# Patient Record
Sex: Female | Born: 2000
Health system: Southern US, Community
[De-identification: ages and names within clinical notes are randomized; demographics above are authoritative.]

## PROBLEM LIST (undated history)

## (undated) DIAGNOSIS — F329 Major depressive disorder, single episode, unspecified: Secondary | ICD-10-CM

## (undated) DIAGNOSIS — F419 Anxiety disorder, unspecified: Secondary | ICD-10-CM

## (undated) DIAGNOSIS — F32A Depression, unspecified: Secondary | ICD-10-CM

## (undated) DIAGNOSIS — F909 Attention-deficit hyperactivity disorder, unspecified type: Secondary | ICD-10-CM

---
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
## 2018-06-09 DIAGNOSIS — F909 Attention-deficit hyperactivity disorder, unspecified type: Secondary | ICD-10-CM | POA: Diagnosis not present

## 2018-06-09 DIAGNOSIS — F419 Anxiety disorder, unspecified: Secondary | ICD-10-CM | POA: Diagnosis not present

## 2018-06-09 DIAGNOSIS — R079 Chest pain, unspecified: Secondary | ICD-10-CM | POA: Diagnosis not present

## 2018-06-09 DIAGNOSIS — R55 Syncope and collapse: Secondary | ICD-10-CM | POA: Diagnosis not present

## 2018-06-24 DIAGNOSIS — Z0000 Encounter for general adult medical examination without abnormal findings: Secondary | ICD-10-CM | POA: Diagnosis not present

## 2018-06-24 DIAGNOSIS — F419 Anxiety disorder, unspecified: Secondary | ICD-10-CM | POA: Diagnosis not present

## 2018-06-24 DIAGNOSIS — F909 Attention-deficit hyperactivity disorder, unspecified type: Secondary | ICD-10-CM | POA: Diagnosis not present

## 2018-07-07 DIAGNOSIS — Z3202 Encounter for pregnancy test, result negative: Secondary | ICD-10-CM | POA: Diagnosis not present

## 2018-07-07 DIAGNOSIS — N83209 Unspecified ovarian cyst, unspecified side: Secondary | ICD-10-CM | POA: Diagnosis not present

## 2018-07-07 DIAGNOSIS — R102 Pelvic and perineal pain: Secondary | ICD-10-CM | POA: Diagnosis not present

## 2018-07-07 DIAGNOSIS — N390 Urinary tract infection, site not specified: Secondary | ICD-10-CM | POA: Diagnosis not present

## 2018-08-07 DIAGNOSIS — N390 Urinary tract infection, site not specified: Secondary | ICD-10-CM | POA: Diagnosis not present

## 2018-08-07 DIAGNOSIS — Z01419 Encounter for gynecological examination (general) (routine) without abnormal findings: Secondary | ICD-10-CM | POA: Diagnosis not present

## 2018-08-07 DIAGNOSIS — Z113 Encounter for screening for infections with a predominantly sexual mode of transmission: Secondary | ICD-10-CM | POA: Diagnosis not present

## 2018-08-07 DIAGNOSIS — Z6825 Body mass index (BMI) 25.0-25.9, adult: Secondary | ICD-10-CM | POA: Diagnosis not present

## 2018-08-07 DIAGNOSIS — N760 Acute vaginitis: Secondary | ICD-10-CM | POA: Diagnosis not present

## 2018-09-29 DIAGNOSIS — Z3009 Encounter for other general counseling and advice on contraception: Secondary | ICD-10-CM | POA: Diagnosis not present

## 2018-09-29 DIAGNOSIS — N760 Acute vaginitis: Secondary | ICD-10-CM | POA: Diagnosis not present

## 2018-09-29 DIAGNOSIS — Z113 Encounter for screening for infections with a predominantly sexual mode of transmission: Secondary | ICD-10-CM | POA: Diagnosis not present

## 2018-09-30 IMAGING — MR MR PELVIS W/O CM
6 of 8 series · 32 of 48 positions shown · non-contrast
Comparison: CT scan 06/08/2015

CLINICAL DATA: Possibly uterine anomaly.

EXAM:
MRI PELVIS WITHOUT CONTRAST
TECHNIQUE: Multiplanar multisequence MR imaging of the pelvis was performed. No
intravenous contrast was administered.

[Series 3: t2_tse_sag · sagittal · 5.0mm · 1.05mm/px · 6 of 27 slices shown]
[im 1/27]
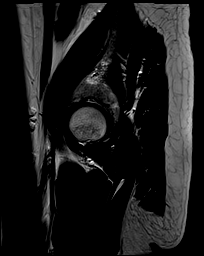
[im 6/27]
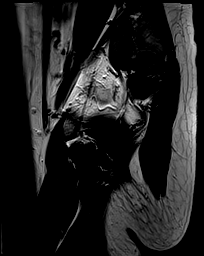
[im 11/27]
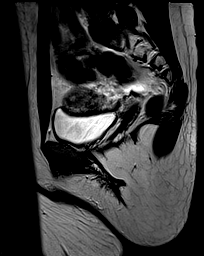
[im 16/27]
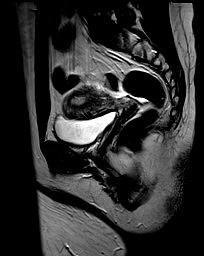
[im 21/27]
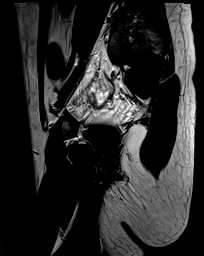
[im 27/27]
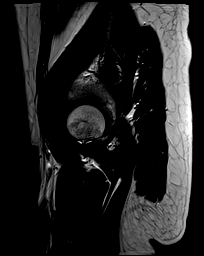

[Series 4: t2_tse axial · axial · 5.0mm · 0.55mm/px · z∈[-128,+22]mm · 6 of 26 slices shown]
[im 1/26]
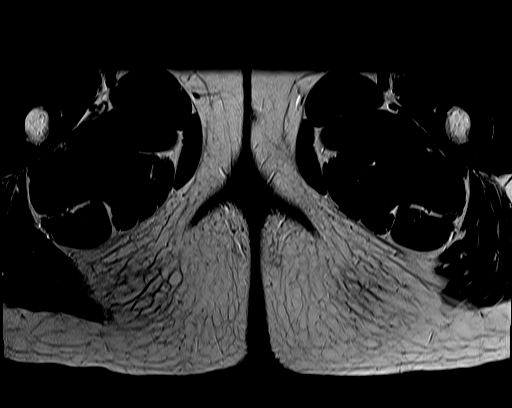
[im 6/26]
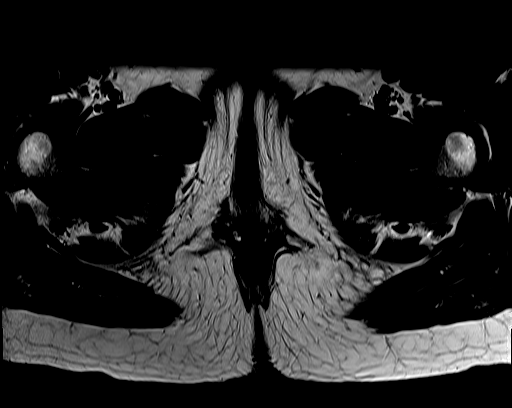
[im 11/26]
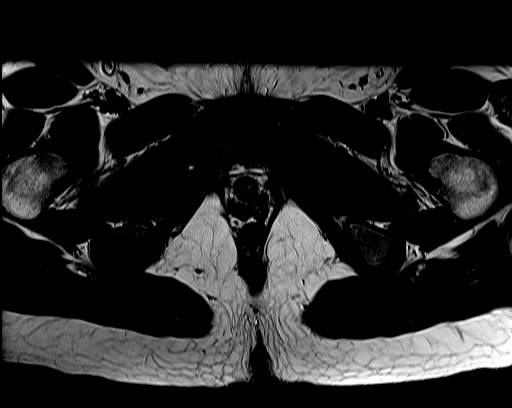
[im 16/26]
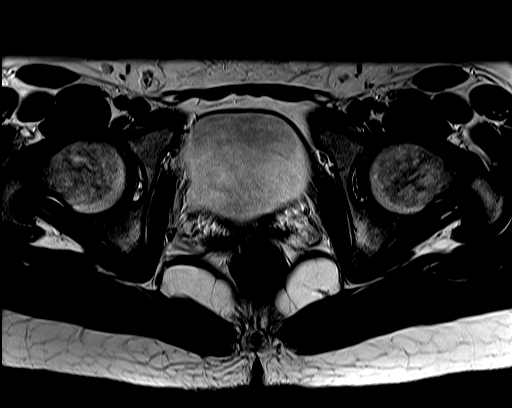
[im 21/26]
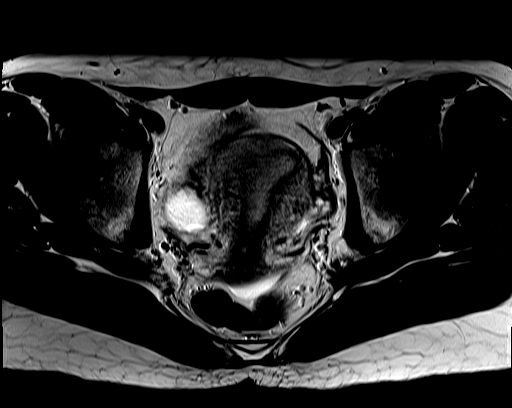
[im 26/26]
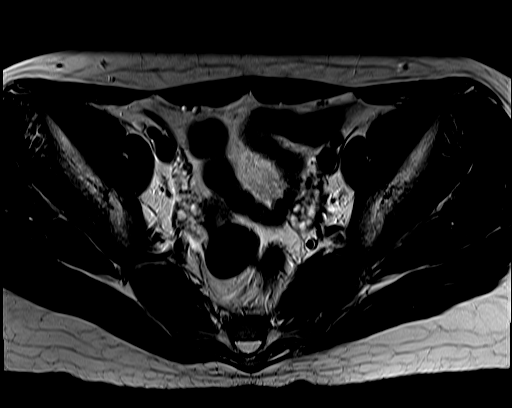

[Series 5: t2_tse axial fs · axial · 5.0mm · 0.55mm/px · z∈[-128,+22]mm · 6 of 26 slices shown]
[im 1/26]
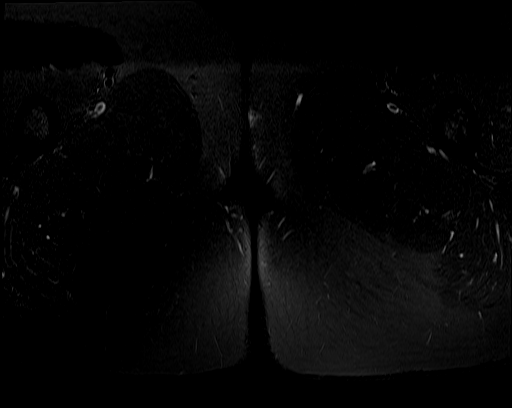
[im 6/26]
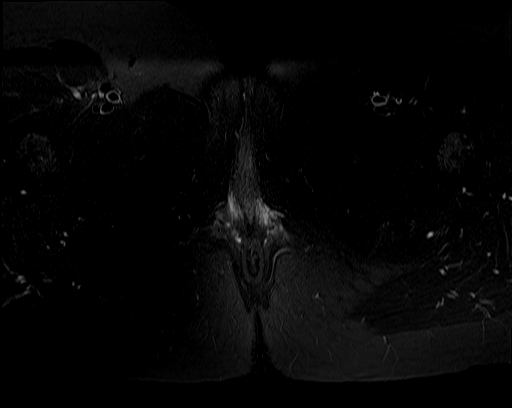
[im 11/26]
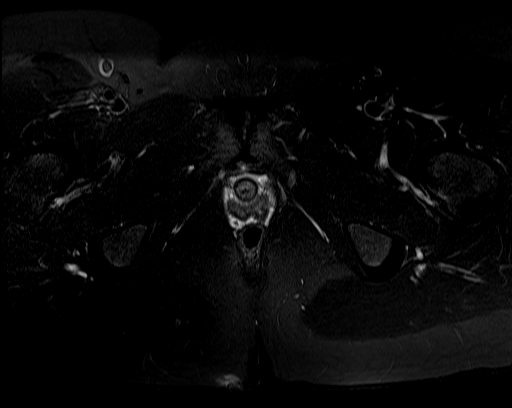
[im 16/26]
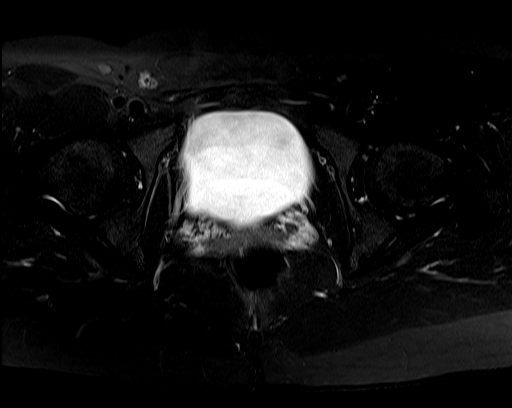
[im 21/26]
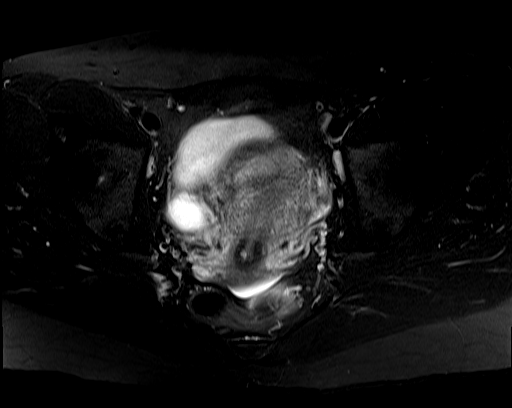
[im 26/26]
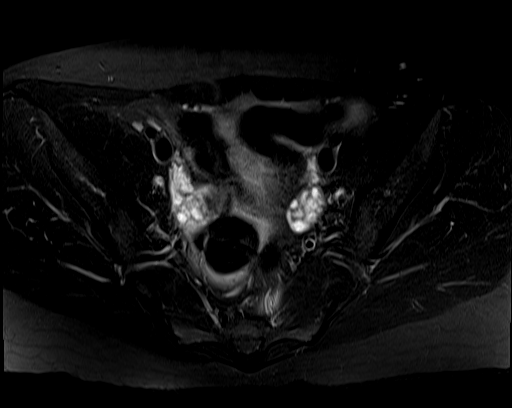

[Series 6: axial spgr · axial · 5.0mm · 0.94mm/px · z∈[-128,+22]mm · 6 of 26 slices shown]
[im 1/26]
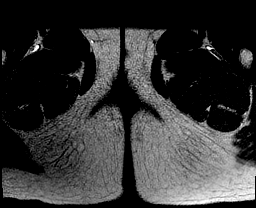
[im 6/26]
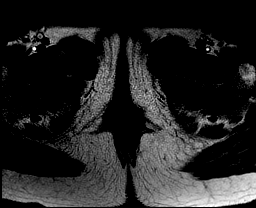
[im 11/26]
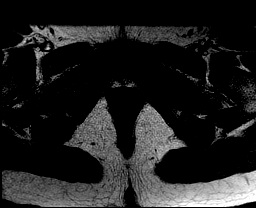
[im 16/26]
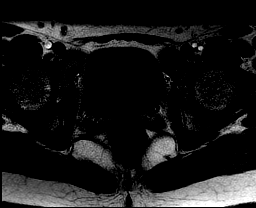
[im 21/26]
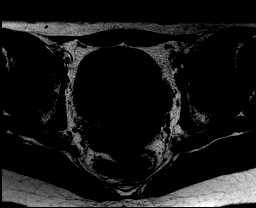
[im 26/26]
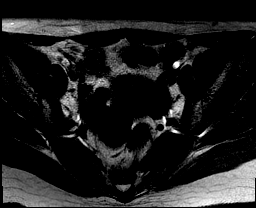

[Series 8: axial spgr pre · axial · non-contrast · 5.0mm · 0.47mm/px · z∈[-128,+22]mm · 6 of 26 slices shown]
[im 1/26]
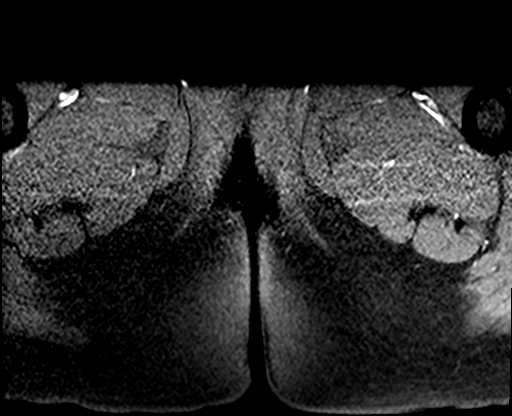
[im 6/26]
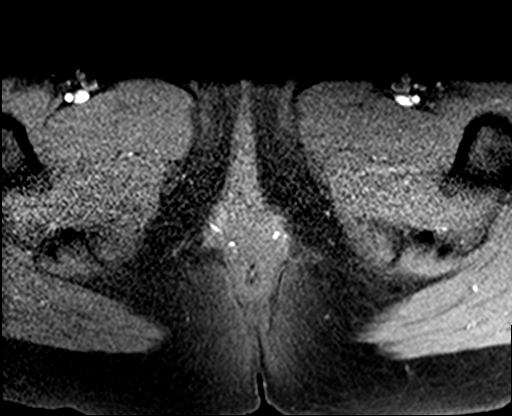
[im 11/26]
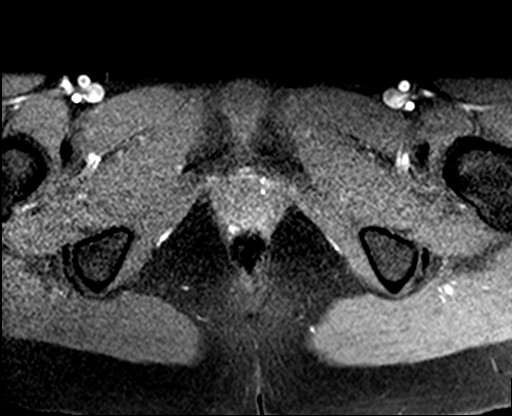
[im 16/26]
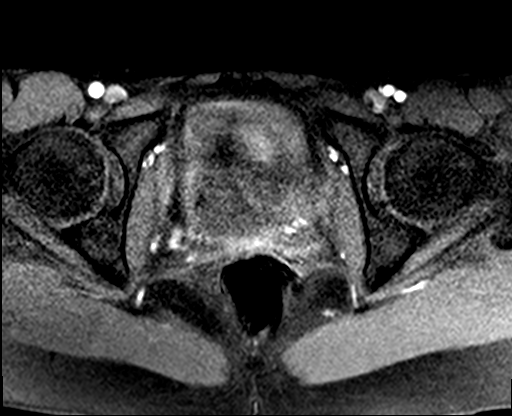
[im 21/26]
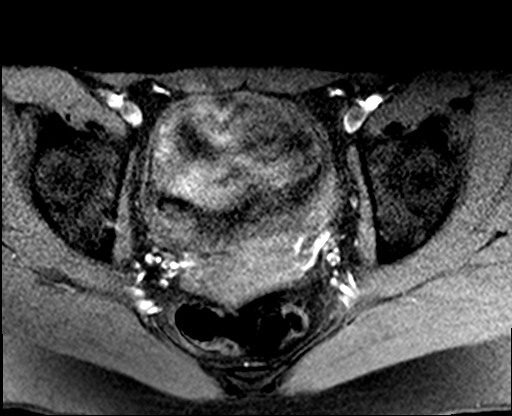
[im 26/26]
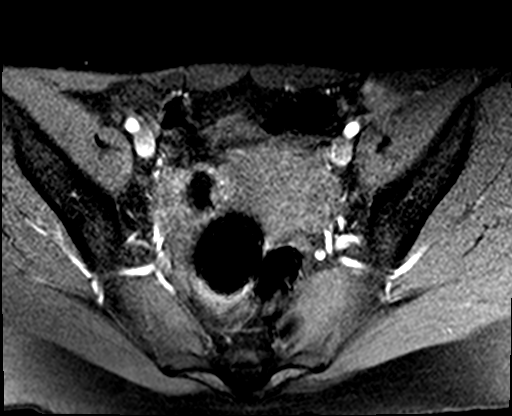

[Series 9: cor haste · coronal · 6.0mm · 0.78mm/px · 2 of 25 slices shown]
[im 1/25]
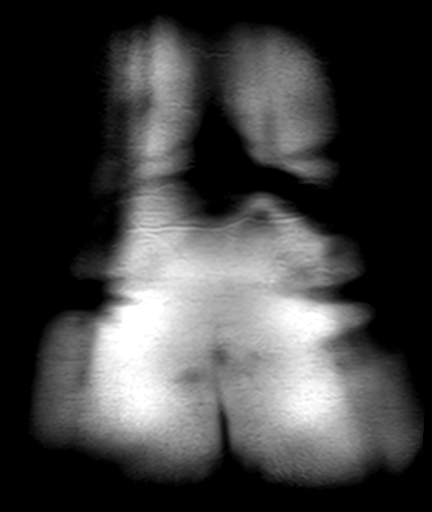
[im 5/25]
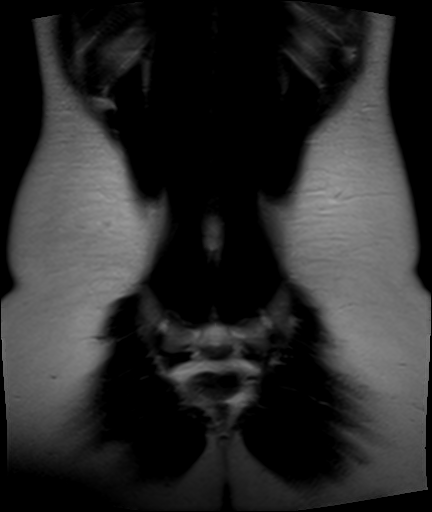

[32 of 48 positions shown; findings below may reference images not displayed]

FINDINGS: Urinary Tract: Urinary bladder unremarkable. Normal urethra without
evidence for diverticulum.

Bowel:  Visualized bowel loops unremarkable.

Vascular/Lymphatic: No evidence for aortic aneurysm. No pelvic
sidewall lymphadenopathy.

Reproductive: Uterus measures 8.3 x 3.3 x 5.5 cm. Junctional zone is
well preserved throughout. No uterine mass evident. Uterine fundal
anatomy is normal without evidence for arcuate, septate, or
bicornuate uterus.

Right ovary measures 3.1 x 4.6 x 3.9 cm. Bilobed homogeneous
well-defined cyst in the right ovary measures 2.8 x 3.8 x 2.6 cm.

Left ovary measures 1.8 x 4.3 x 3.0 cm and is normal.

Other:  Trace free fluid noted in the cul-de-sac.

Musculoskeletal: No abnormal marrow signal within the visualized
bony anatomy.
IMPRESSION: 1. No evidence for Mullerian duct anomaly.
2. 3.8 cm cyst with benign features noted in the right ovary.
3. Trace intraperitoneal free fluid in, likely physiologic.

## 2018-10-14 DIAGNOSIS — J029 Acute pharyngitis, unspecified: Secondary | ICD-10-CM | POA: Diagnosis not present

## 2018-10-14 DIAGNOSIS — N926 Irregular menstruation, unspecified: Secondary | ICD-10-CM | POA: Diagnosis not present

## 2018-10-14 DIAGNOSIS — R6889 Other general symptoms and signs: Secondary | ICD-10-CM | POA: Diagnosis not present

## 2019-02-24 DIAGNOSIS — F909 Attention-deficit hyperactivity disorder, unspecified type: Secondary | ICD-10-CM | POA: Diagnosis not present

## 2019-02-24 DIAGNOSIS — Z7189 Other specified counseling: Secondary | ICD-10-CM | POA: Diagnosis not present

## 2019-02-24 DIAGNOSIS — R0602 Shortness of breath: Secondary | ICD-10-CM | POA: Diagnosis not present

## 2019-02-24 DIAGNOSIS — F4321 Adjustment disorder with depressed mood: Secondary | ICD-10-CM | POA: Diagnosis not present

## 2019-02-27 DIAGNOSIS — F410 Panic disorder [episodic paroxysmal anxiety] without agoraphobia: Secondary | ICD-10-CM | POA: Diagnosis not present

## 2019-02-27 DIAGNOSIS — F320 Major depressive disorder, single episode, mild: Secondary | ICD-10-CM | POA: Diagnosis not present

## 2019-02-27 DIAGNOSIS — F419 Anxiety disorder, unspecified: Secondary | ICD-10-CM | POA: Diagnosis not present

## 2019-02-27 DIAGNOSIS — F909 Attention-deficit hyperactivity disorder, unspecified type: Secondary | ICD-10-CM | POA: Diagnosis not present

## 2019-04-09 DIAGNOSIS — F419 Anxiety disorder, unspecified: Secondary | ICD-10-CM | POA: Diagnosis not present

## 2019-04-09 DIAGNOSIS — F909 Attention-deficit hyperactivity disorder, unspecified type: Secondary | ICD-10-CM | POA: Diagnosis not present

## 2019-04-09 DIAGNOSIS — F320 Major depressive disorder, single episode, mild: Secondary | ICD-10-CM | POA: Diagnosis not present

## 2019-04-09 DIAGNOSIS — F410 Panic disorder [episodic paroxysmal anxiety] without agoraphobia: Secondary | ICD-10-CM | POA: Diagnosis not present

## 2019-04-22 DIAGNOSIS — N760 Acute vaginitis: Secondary | ICD-10-CM | POA: Diagnosis not present

## 2019-04-22 DIAGNOSIS — Z113 Encounter for screening for infections with a predominantly sexual mode of transmission: Secondary | ICD-10-CM | POA: Diagnosis not present

## 2019-04-22 DIAGNOSIS — R350 Frequency of micturition: Secondary | ICD-10-CM | POA: Diagnosis not present

## 2019-05-08 DIAGNOSIS — F320 Major depressive disorder, single episode, mild: Secondary | ICD-10-CM | POA: Diagnosis not present

## 2019-05-08 DIAGNOSIS — F410 Panic disorder [episodic paroxysmal anxiety] without agoraphobia: Secondary | ICD-10-CM | POA: Diagnosis not present

## 2019-05-08 DIAGNOSIS — F419 Anxiety disorder, unspecified: Secondary | ICD-10-CM | POA: Diagnosis not present

## 2019-05-08 DIAGNOSIS — F909 Attention-deficit hyperactivity disorder, unspecified type: Secondary | ICD-10-CM | POA: Diagnosis not present

## 2019-06-16 ENCOUNTER — Encounter (HOSPITAL_COMMUNITY): Payer: Self-pay

## 2019-06-16 ENCOUNTER — Emergency Department (HOSPITAL_COMMUNITY)
Admission: EM | Admit: 2019-06-16 | Discharge: 2019-06-17 | Discharge status: Against Medical Advice | Payer: BC Managed Care – PPO | Attending: Emergency Medicine | Admitting: Emergency Medicine

## 2019-06-16 ENCOUNTER — Other Ambulatory Visit: Payer: Self-pay

## 2019-06-16 DIAGNOSIS — Z5321 Procedure and treatment not carried out due to patient leaving prior to being seen by health care provider: Secondary | ICD-10-CM | POA: Diagnosis not present

## 2019-06-16 DIAGNOSIS — R55 Syncope and collapse: Secondary | ICD-10-CM | POA: Diagnosis not present

## 2019-06-16 HISTORY — DX: Attention-deficit hyperactivity disorder, unspecified type: F90.9

## 2019-06-16 HISTORY — DX: Depression, unspecified: F32.A

## 2019-06-16 HISTORY — DX: Anxiety disorder, unspecified: F41.9

## 2019-06-16 MED ORDER — SODIUM CHLORIDE 0.9% FLUSH: 3.0000 mL | Freq: Once | INTRAVENOUS | Status: DC

## 2019-06-16 NOTE — ED Triage Notes (Signed)
Pt states that she has been feeling bad for the past couple of days, generalized weakness and headaches, today had a syncopal episode at work with episode of vomiting, pt also having dysuria and foul odor of urine

## 2019-06-17 DIAGNOSIS — R8290 Unspecified abnormal findings in urine: Secondary | ICD-10-CM | POA: Diagnosis not present

## 2019-06-17 DIAGNOSIS — R55 Syncope and collapse: Secondary | ICD-10-CM | POA: Diagnosis not present

## 2019-06-17 DIAGNOSIS — N3000 Acute cystitis without hematuria: Secondary | ICD-10-CM | POA: Diagnosis not present

## 2019-06-17 DIAGNOSIS — F419 Anxiety disorder, unspecified: Secondary | ICD-10-CM | POA: Diagnosis not present

## 2019-06-17 DIAGNOSIS — F909 Attention-deficit hyperactivity disorder, unspecified type: Secondary | ICD-10-CM | POA: Diagnosis not present

## 2019-06-17 LAB — URINALYSIS, ROUTINE W REFLEX MICROSCOPIC
Bilirubin Urine: NEGATIVE
Glucose, UA: NEGATIVE mg/dL
Ketones, ur: 20 mg/dL — AB
Nitrite: NEGATIVE
Protein, ur: 100 mg/dL — AB
Specific Gravity, Urine: 1.014 (ref 1.005–1.030)
WBC, UA: >50 WBC/hpf — ABNORMAL HIGH (ref 0–5)
pH: 6 (ref 5.0–8.0)

## 2019-06-17 LAB — CBC
HCT: 37.9 % (ref 36.0–46.0)
Hemoglobin: 13 g/dL (ref 12.0–15.0)
MCH: 30.2 pg (ref 26.0–34.0)
MCHC: 34.3 g/dL (ref 30.0–36.0)
MCV: 88.1 fL (ref 80.0–100.0)
Platelets: 203 10*3/uL (ref 150–400)
RBC: 4.3 MIL/uL (ref 3.87–5.11)
RDW: 11.7 % (ref 11.5–15.5)
WBC: 7.1 10*3/uL (ref 4.0–10.5)
nRBC: 0 % (ref 0.0–0.2)

## 2019-06-17 LAB — BASIC METABOLIC PANEL
Anion gap: 12 (ref 5–15)
BUN: 7 mg/dL (ref 6–20)
CO2: 24 mmol/L (ref 22–32)
Calcium: 9.3 mg/dL (ref 8.9–10.3)
Chloride: 96 mmol/L — ABNORMAL LOW (ref 98–111)
Creatinine, Ser: 0.85 mg/dL (ref 0.44–1.00)
GFR calc Af Amer: >60 mL/min (ref >60)
GFR calc non Af Amer: >60 mL/min (ref >60)
Glucose, Bld: 104 mg/dL — ABNORMAL HIGH (ref 70–99)
Potassium: 4.2 mmol/L (ref 3.5–5.1)
Sodium: 132 mmol/L — ABNORMAL LOW (ref 135–145)

## 2019-06-17 LAB — I-STAT BETA HCG BLOOD, ED (MC, WL, AP ONLY): I-stat hCG, quantitative: <5 m[IU]/mL (ref <5)

## 2019-06-17 NOTE — ED Notes (Signed)
No answer in waiting room x3

## 2019-06-18 ENCOUNTER — Emergency Department (HOSPITAL_COMMUNITY)
Admission: EM | Admit: 2019-06-18 | Discharge: 2019-06-18 | Disposition: A | Discharge status: Home / Self Care | Payer: BC Managed Care – PPO | Attending: Emergency Medicine | Admitting: Emergency Medicine

## 2019-06-18 ENCOUNTER — Encounter (HOSPITAL_COMMUNITY): Payer: Self-pay | Admitting: *Deleted

## 2019-06-18 ENCOUNTER — Other Ambulatory Visit: Payer: Self-pay

## 2019-06-18 DIAGNOSIS — I959 Hypotension, unspecified: Secondary | ICD-10-CM | POA: Diagnosis not present

## 2019-06-18 DIAGNOSIS — N3000 Acute cystitis without hematuria: Secondary | ICD-10-CM | POA: Insufficient documentation

## 2019-06-18 DIAGNOSIS — R031 Nonspecific low blood-pressure reading: Secondary | ICD-10-CM | POA: Diagnosis not present

## 2019-06-18 DIAGNOSIS — R3915 Urgency of urination: Secondary | ICD-10-CM | POA: Diagnosis not present

## 2019-06-18 LAB — CBC WITH DIFFERENTIAL/PLATELET
Abs Immature Granulocytes: 0.03 10*3/uL (ref 0.00–0.07)
Basophils Absolute: 0 10*3/uL (ref 0.0–0.1)
Basophils Relative: 0 %
Eosinophils Absolute: 0 10*3/uL (ref 0.0–0.5)
Eosinophils Relative: 0 %
HCT: 36.9 % (ref 36.0–46.0)
Hemoglobin: 12.6 g/dL (ref 12.0–15.0)
Immature Granulocytes: 1 %
Lymphocytes Relative: 20 %
Lymphs Abs: 1.3 10*3/uL (ref 0.7–4.0)
MCH: 30.2 pg (ref 26.0–34.0)
MCHC: 34.1 g/dL (ref 30.0–36.0)
MCV: 88.5 fL (ref 80.0–100.0)
Monocytes Absolute: 0.9 10*3/uL (ref 0.1–1.0)
Monocytes Relative: 14 %
Neutro Abs: 4.3 10*3/uL (ref 1.7–7.7)
Neutrophils Relative %: 65 %
Platelets: 230 10*3/uL (ref 150–400)
RBC: 4.17 MIL/uL (ref 3.87–5.11)
RDW: 11.8 % (ref 11.5–15.5)
WBC: 6.5 10*3/uL (ref 4.0–10.5)
nRBC: 0 % (ref 0.0–0.2)

## 2019-06-18 LAB — URINALYSIS, ROUTINE W REFLEX MICROSCOPIC
Bilirubin Urine: NEGATIVE
Glucose, UA: NEGATIVE mg/dL
Hgb urine dipstick: NEGATIVE
Ketones, ur: NEGATIVE mg/dL
Nitrite: NEGATIVE
Protein, ur: NEGATIVE mg/dL
Specific Gravity, Urine: 1.013 (ref 1.005–1.030)
pH: 6 (ref 5.0–8.0)

## 2019-06-18 LAB — LACTIC ACID, PLASMA: Lactic Acid, Venous: 1.6 mmol/L (ref 0.5–1.9)

## 2019-06-18 LAB — COMPREHENSIVE METABOLIC PANEL
ALT: 28 U/L (ref 0–44)
AST: 26 U/L (ref 15–41)
Albumin: 3.5 g/dL (ref 3.5–5.0)
Alkaline Phosphatase: 75 U/L (ref 38–126)
Anion gap: 13 (ref 5–15)
BUN: <5 mg/dL — ABNORMAL LOW (ref 6–20)
CO2: 24 mmol/L (ref 22–32)
Calcium: 9 mg/dL (ref 8.9–10.3)
Chloride: 99 mmol/L (ref 98–111)
Creatinine, Ser: 0.83 mg/dL (ref 0.44–1.00)
GFR calc Af Amer: >60 mL/min (ref >60)
GFR calc non Af Amer: >60 mL/min (ref >60)
Glucose, Bld: 134 mg/dL — ABNORMAL HIGH (ref 70–99)
Potassium: 3.2 mmol/L — ABNORMAL LOW (ref 3.5–5.1)
Sodium: 136 mmol/L (ref 135–145)
Total Bilirubin: 0.7 mg/dL (ref 0.3–1.2)
Total Protein: 6.9 g/dL (ref 6.5–8.1)

## 2019-06-18 LAB — I-STAT BETA HCG BLOOD, ED (MC, WL, AP ONLY): I-stat hCG, quantitative: <5 m[IU]/mL (ref <5)

## 2019-06-18 MED ORDER — SODIUM CHLORIDE 0.9 % IV BOLUS
1000.0000 mL | Freq: Once | INTRAVENOUS | Status: AC
  Administered 2019-06-18: 1000 mL via INTRAVENOUS

## 2019-06-18 MED ORDER — PHENAZOPYRIDINE HCL 100 MG PO TABS
95.0000 mg | ORAL_TABLET | Freq: Once | ORAL | Status: AC
  Administered 2019-06-18: 18:00:00 100 mg via ORAL
  Filled 2019-06-18: qty 1

## 2019-06-18 MED ORDER — PHENAZOPYRIDINE HCL 200 MG PO TABS: 200.0000 mg | ORAL_TABLET | Freq: Three times a day (TID) | ORAL | 0 refills | Status: AC | PRN

## 2019-06-18 NOTE — ED Triage Notes (Signed)
Pt seen on 9/1 and diagnosed with UTI after having syncopal episode. Pt still having fatigue, weakness and now fever. Sent here for possible dehydration and iv meds.

## 2019-06-18 NOTE — ED Provider Notes (Signed)
[facility] Provider Note   CSN: [id number] Arrival date & time: 06/18/19  1445     History   Chief Complaint Chief Complaint  Patient presents with  . Fever  . Urinary Tract Infection    HPI Julie [facility] is a 18 y.o. female with a history of recurrent urinary tract infections who presents the emergency department with low blood pressure, and urgency to urinate.  Patient was seen by her PCP yesterday.  She was diagnosed with urinary tract infection and started on Cipro.  She has been taking her medication as directed.  The patient states that she feels lightheaded and dizzy whenever she stands.  She has had this in the past when she has had infections.  She denies abdominal pain, back pain, fevers, chills, nausea, vomiting.  She has been eating and drinking normally.  Patient states that she feels like she has to urinate but nothing is coming out.     HPI  Past Medical History:  Diagnosis Date  . ADHD   . Anxiety   . Depression     There are no active problems to display for this patient.   History reviewed. No pertinent surgical history.   OB History   No obstetric history on file.      Home Medications    Prior to Admission medications   Medication Sig Start Date End Date Taking? Authorizing Provider  HYDROcodone-acetaminophen (NORCO/VICODIN) 5-325 MG per tablet Take 1-2 tablets by mouth every 6 (six) hours as needed for moderate pain. Patient not taking: Reported on 06/08/2015 10/15/14   Louanne Skye, MD  ibuprofen (ADVIL,MOTRIN) 200 MG tablet Take 200 mg by mouth every 6 (six) hours as needed for headache.    [provider]    Family History History reviewed. No pertinent family history.  Social History Social History   Tobacco Use  . Smoking status: Never Smoker  Substance Use Topics  . Alcohol use: No  . Drug use: Not on file     Allergies   Ceftin [cefuroxime axetil]   Review of Systems Review of  Systems  Ten systems reviewed and are negative for acute change, except as noted in the HPI.   Physical Exam Updated Vital Signs BP 92/64 (BP Location: Right Arm)   Pulse 70   Temp 98.3 F (36.8 C) (Oral)   Resp 18   LMP 06/16/2019   SpO2 95%   Physical Exam Vitals signs and nursing note reviewed.  Constitutional:      General: She is not in acute distress.    Appearance: She is well-developed. She is not diaphoretic.  HENT:     Head: Normocephalic and atraumatic.  Eyes:     General: No scleral icterus.    Conjunctiva/sclera: Conjunctivae normal.  Neck:     Musculoskeletal: Normal range of motion.  Cardiovascular:     Rate and Rhythm: Normal rate and regular rhythm.     Heart sounds: Normal heart sounds. No murmur. No friction rub. No gallop.   Pulmonary:     Effort: Pulmonary effort is normal. No respiratory distress.     Breath sounds: Normal breath sounds.  Abdominal:     General: Bowel sounds are normal. There is no distension.     Palpations: Abdomen is soft. There is no mass.     Tenderness: There is no abdominal tenderness. There is no right CVA tenderness, left CVA tenderness or guarding.  Skin:    General: Skin is warm  and dry.  Neurological:     Mental Status: She is alert and oriented to person, place, and time.  Psychiatric:        Behavior: Behavior normal.      ED Treatments / Results  Labs (all labs ordered are listed, but only abnormal results are displayed) Labs Reviewed  COMPREHENSIVE METABOLIC PANEL - Abnormal; Notable for the following components:      Result Value   Potassium 3.2 (*)    Glucose, Bld 134 (*)    BUN <5 (*)    All other components within normal limits  LACTIC ACID, PLASMA  CBC WITH DIFFERENTIAL/PLATELET  LACTIC ACID, PLASMA  URINALYSIS, ROUTINE W REFLEX MICROSCOPIC  I-STAT BETA HCG BLOOD, ED ([facility], [facility], [facility] ONLY)    EKG None  Radiology No results found.  Procedures Procedures (including critical care time)   Medications Ordered in ED Medications  sodium chloride 0.9 % bolus 1,000 mL (has no administration in time range)     Initial Impression / Assessment and Plan / ED Course  I have reviewed the triage vital signs and the nursing notes.  Pertinent labs & imaging results that were available during my care of the patient were reviewed by me and considered in my medical decision making (see chart for details).        18 year old female here with urinary tract infection.  Decreased urinary output is likely attributed to sensation of urinary urgency patient's blood pressure has been soft.  Her orthostatic vital signs are negative.  Given the liter of fluid and Pyridium.  She feels improved.  Personally reviewed the patient's labs which show 21-50 white blood cells, rare bacteria.  Negative pregnancy test, mild hypokalemia with slightly elevated blood glucose, normal creatinine and BUN.  CBC shows no elevated white blood cell count, lactic acid within normal limits.  Patient is to follow-up with her primary care physician.  Appears appropriate for discharge at this time. Final Clinical Impressions(s) / ED Diagnoses   Final diagnoses:  None    ED Discharge Orders    None       Arthor CaptainHarris, Adia Crammer, PA-C 06/18/19 2304    Arby BarrettePfeiffer, Marcy, MD 06/20/19 [phone]

## 2019-06-18 NOTE — ED Notes (Signed)
>  25 ml of urine with bladder scan

## 2019-06-18 NOTE — Discharge Instructions (Addendum)
Contact a health care provider if:  Your symptoms do not get better after 1-2 days.  Your symptoms go away and then return.  Get help right away if you have:  Severe pain in your back or your lower abdomen.  A fever.  Nausea or vomiting.

## 2019-06-19 LAB — URINE CULTURE: Culture: NO GROWTH

## 2019-08-20 DIAGNOSIS — Z1159 Encounter for screening for other viral diseases: Secondary | ICD-10-CM | POA: Diagnosis not present

## 2019-08-25 DIAGNOSIS — Z1159 Encounter for screening for other viral diseases: Secondary | ICD-10-CM | POA: Diagnosis not present

## 2020-10-04 DIAGNOSIS — J069 Acute upper respiratory infection, unspecified: Secondary | ICD-10-CM | POA: Diagnosis not present

## 2020-10-04 DIAGNOSIS — K5909 Other constipation: Secondary | ICD-10-CM | POA: Diagnosis not present

## 2020-10-12 DIAGNOSIS — E282 Polycystic ovarian syndrome: Secondary | ICD-10-CM | POA: Diagnosis not present

## 2020-10-12 DIAGNOSIS — Z3202 Encounter for pregnancy test, result negative: Secondary | ICD-10-CM | POA: Diagnosis not present

## 2020-10-12 DIAGNOSIS — Z319 Encounter for procreative management, unspecified: Secondary | ICD-10-CM | POA: Diagnosis not present

## 2020-10-12 DIAGNOSIS — R1032 Left lower quadrant pain: Secondary | ICD-10-CM | POA: Diagnosis not present

## 2020-10-12 DIAGNOSIS — N926 Irregular menstruation, unspecified: Secondary | ICD-10-CM | POA: Diagnosis not present

## 2020-11-29 DIAGNOSIS — H9202 Otalgia, left ear: Secondary | ICD-10-CM | POA: Diagnosis not present

## 2020-11-29 DIAGNOSIS — J0100 Acute maxillary sinusitis, unspecified: Secondary | ICD-10-CM | POA: Diagnosis not present

## 2020-12-30 DIAGNOSIS — F320 Major depressive disorder, single episode, mild: Secondary | ICD-10-CM | POA: Diagnosis not present

## 2020-12-30 DIAGNOSIS — G43919 Migraine, unspecified, intractable, without status migrainosus: Secondary | ICD-10-CM | POA: Diagnosis not present

## 2020-12-30 DIAGNOSIS — R5383 Other fatigue: Secondary | ICD-10-CM | POA: Diagnosis not present

## 2021-04-07 DIAGNOSIS — E559 Vitamin D deficiency, unspecified: Secondary | ICD-10-CM | POA: Diagnosis not present

## 2021-05-04 DIAGNOSIS — N912 Amenorrhea, unspecified: Secondary | ICD-10-CM | POA: Diagnosis not present

## 2021-05-04 DIAGNOSIS — R8290 Unspecified abnormal findings in urine: Secondary | ICD-10-CM | POA: Diagnosis not present

## 2021-05-04 DIAGNOSIS — N760 Acute vaginitis: Secondary | ICD-10-CM | POA: Diagnosis not present

## 2021-05-04 DIAGNOSIS — N390 Urinary tract infection, site not specified: Secondary | ICD-10-CM | POA: Diagnosis not present

## 2021-06-13 DIAGNOSIS — N3020 Other chronic cystitis without hematuria: Secondary | ICD-10-CM | POA: Diagnosis not present

## 2021-06-29 DIAGNOSIS — Z6832 Body mass index (BMI) 32.0-32.9, adult: Secondary | ICD-10-CM | POA: Diagnosis not present

## 2021-06-29 DIAGNOSIS — Z113 Encounter for screening for infections with a predominantly sexual mode of transmission: Secondary | ICD-10-CM | POA: Diagnosis not present

## 2021-06-29 DIAGNOSIS — Z01419 Encounter for gynecological examination (general) (routine) without abnormal findings: Secondary | ICD-10-CM | POA: Diagnosis not present

## 2021-07-14 DIAGNOSIS — N390 Urinary tract infection, site not specified: Secondary | ICD-10-CM | POA: Diagnosis not present

## 2021-07-14 DIAGNOSIS — B964 Proteus (mirabilis) (morganii) as the cause of diseases classified elsewhere: Secondary | ICD-10-CM | POA: Diagnosis not present

## 2021-09-19 DIAGNOSIS — F320 Major depressive disorder, single episode, mild: Secondary | ICD-10-CM | POA: Diagnosis not present

## 2021-09-19 DIAGNOSIS — F419 Anxiety disorder, unspecified: Secondary | ICD-10-CM | POA: Diagnosis not present

## 2021-09-19 DIAGNOSIS — F909 Attention-deficit hyperactivity disorder, unspecified type: Secondary | ICD-10-CM | POA: Diagnosis not present

## 2021-11-24 DIAGNOSIS — F901 Attention-deficit hyperactivity disorder, predominantly hyperactive type: Secondary | ICD-10-CM | POA: Diagnosis not present

## 2021-11-24 DIAGNOSIS — F419 Anxiety disorder, unspecified: Secondary | ICD-10-CM | POA: Diagnosis not present

## 2022-01-30 DIAGNOSIS — R631 Polydipsia: Secondary | ICD-10-CM | POA: Diagnosis not present

## 2022-01-30 DIAGNOSIS — Z7289 Other problems related to lifestyle: Secondary | ICD-10-CM | POA: Diagnosis not present

## 2022-01-30 DIAGNOSIS — E282 Polycystic ovarian syndrome: Secondary | ICD-10-CM | POA: Diagnosis not present

## 2022-05-10 DIAGNOSIS — L600 Ingrowing nail: Secondary | ICD-10-CM | POA: Diagnosis not present

## 2022-05-18 DIAGNOSIS — F902 Attention-deficit hyperactivity disorder, combined type: Secondary | ICD-10-CM | POA: Diagnosis not present

## 2022-05-18 DIAGNOSIS — R42 Dizziness and giddiness: Secondary | ICD-10-CM | POA: Diagnosis not present

## 2022-05-18 DIAGNOSIS — F419 Anxiety disorder, unspecified: Secondary | ICD-10-CM | POA: Diagnosis not present

## 2022-05-21 ENCOUNTER — Ambulatory Visit (INDEPENDENT_AMBULATORY_CARE_PROVIDER_SITE_OTHER): Payer: BC Managed Care – PPO | Admitting: Podiatry

## 2022-05-21 ENCOUNTER — Encounter: Payer: Self-pay | Admitting: Podiatry

## 2022-05-21 DIAGNOSIS — L600 Ingrowing nail: Secondary | ICD-10-CM | POA: Diagnosis not present

## 2022-05-21 NOTE — Progress Notes (Unsigned)
Subjective:   Patient ID: Margaretha Seeds, female   DOB: 21 y.o.   MRN: [id number]   HPI 21 year old female presents the office today with concerns of ingrown toenails on both of her big toes.  She has been on Keflex which helps some.  She says no longer hurts as much was more burning milliamps.  No pus at this time.  Review of Systems  All other systems reviewed and are negative.  Past Medical History:  Diagnosis Date   ADHD    Anxiety    Depression     No past surgical history on file.   Current Outpatient Medications:    dextroamphetamine (DEXEDRINE SPANSULE) 15 MG 24 hr capsule, Take 15 mg by mouth daily., Disp: , Rfl:    hydrOXYzine (ATARAX/VISTARIL) 25 MG tablet, Take 25 mg by mouth daily., Disp: , Rfl:    phenazopyridine (PYRIDIUM) 200 MG tablet, Take 1 tablet (200 mg total) by mouth 3 (three) times daily as needed for pain., Disp: 12 tablet, Rfl: 0   sertraline (ZOLOFT) 100 MG tablet, Take 100 mg by mouth daily., Disp: , Rfl:    traMADol (ULTRAM) 50 MG tablet, Take 1 tablet (50 mg total) by mouth every 8 (eight) hours as needed for up to 5 days., Disp: 10 tablet, Rfl: 0  Allergies  Allergen Reactions   Ceftin [Cefuroxime Axetil] Hives           Objective:  Physical Exam  General: AAO x3, NAD  Dermatological: Incurvation present bilateral hallux toenails with localized edema.  No drainage or pus.  No open lesions.  Vascular: Dorsalis Pedis artery and Posterior Tibial artery pedal pulses are 2/4 bilateral with immedate capillary fill time. There is no pain with calf compression, swelling, warmth, erythema.   Neruologic: Grossly intact via light touch bilateral.   Musculoskeletal: Tenderness of ingrown toenails but no other areas of discomfort.  Muscular strength 5/5 in all groups tested bilateral.  Gait: Unassisted, Nonantalgic.       Assessment:   Bilateral hallux ingrown toenails     Plan:  -Treatment options discussed including all alternatives,  risks, and complications -Etiology of symptoms were discussed -At this time, the patient is requesting partial nail removal with chemical matricectomy to the symptomatic portion of the nail. Risks and complications were discussed with the patient for which they understand and written consent was obtained. Under sterile conditions a total of 3 mL of a mixture of 2% lidocaine plain and 0.5% Marcaine plain was infiltrated in a hallux block fashion. Once anesthetized, the skin was prepped in sterile fashion. A tourniquet was then applied. Next the symptomatic borders of hallux nail border was then sharply excised making sure to remove the entire offending nail border. Once the nails were ensured to be removed area was debrided and the underlying skin was intact. There is no purulence identified in the procedure. Next phenol was then applied under standard conditions and copiously irrigated. Silvadene was applied. A dry sterile dressing was applied. After application of the dressing the tourniquet was removed and there is found to be an immediate capillary refill time to the digit. The patient tolerated the procedure well any complications. Post procedure instructions were discussed the patient for which he verbally understood. Discussed signs/symptoms of infection and directed to call the office immediately should any occur or go directly to the emergency room. In the meantime, encouraged to call the office with any questions, concerns, changes symptoms.  Vivi Barrack DPM

## 2022-05-24 ENCOUNTER — Other Ambulatory Visit: Payer: Self-pay | Admitting: Podiatry

## 2022-05-24 ENCOUNTER — Telehealth: Payer: Self-pay

## 2022-05-24 MED ORDER — TRAMADOL HCL 50 MG PO TABS: 50.0000 mg | ORAL_TABLET | Freq: Three times a day (TID) | ORAL | 0 refills | Status: AC | PRN

## 2022-05-27 DIAGNOSIS — L089 Local infection of the skin and subcutaneous tissue, unspecified: Secondary | ICD-10-CM | POA: Diagnosis not present

## 2022-05-28 ENCOUNTER — Telehealth: Payer: Self-pay | Admitting: *Deleted

## 2022-05-28 ENCOUNTER — Encounter: Payer: Self-pay | Admitting: *Deleted

## 2022-05-28 ENCOUNTER — Encounter: Payer: Self-pay | Admitting: Family Medicine

## 2022-05-28 NOTE — Telephone Encounter (Signed)
Requested work note has been approved,written and patient has been contacted for pick up.05/28/22

## 2022-05-28 NOTE — Telephone Encounter (Signed)
Work note ready for pick up from front desk, patient aware.

## 2022-05-28 NOTE — Telephone Encounter (Signed)
Patient is  calling for status of a work note , was out of work all week due to toe pain from ingrown procedural. Once approved , will pick up from front office. Please advise.

## 2022-08-13 DIAGNOSIS — J019 Acute sinusitis, unspecified: Secondary | ICD-10-CM | POA: Diagnosis not present

## 2023-01-18 ENCOUNTER — Other Ambulatory Visit: Payer: Self-pay | Admitting: Obstetrics and Gynecology

## 2023-01-18 DIAGNOSIS — E282 Polycystic ovarian syndrome: Secondary | ICD-10-CM

## 2023-01-18 DIAGNOSIS — F418 Other specified anxiety disorders: Secondary | ICD-10-CM | POA: Diagnosis not present

## 2023-01-18 DIAGNOSIS — E669 Obesity, unspecified: Secondary | ICD-10-CM | POA: Diagnosis not present

## 2023-01-18 DIAGNOSIS — Z113 Encounter for screening for infections with a predominantly sexual mode of transmission: Secondary | ICD-10-CM | POA: Diagnosis not present

## 2023-01-28 ENCOUNTER — Other Ambulatory Visit: Payer: BC Managed Care – PPO

## 2023-02-11 DIAGNOSIS — N898 Other specified noninflammatory disorders of vagina: Secondary | ICD-10-CM | POA: Diagnosis not present

## 2023-02-11 DIAGNOSIS — Z01419 Encounter for gynecological examination (general) (routine) without abnormal findings: Secondary | ICD-10-CM | POA: Diagnosis not present

## 2023-02-11 DIAGNOSIS — Z6833 Body mass index (BMI) 33.0-33.9, adult: Secondary | ICD-10-CM | POA: Diagnosis not present

## 2023-03-08 DIAGNOSIS — N390 Urinary tract infection, site not specified: Secondary | ICD-10-CM | POA: Diagnosis not present

## 2023-08-06 DIAGNOSIS — F902 Attention-deficit hyperactivity disorder, combined type: Secondary | ICD-10-CM | POA: Diagnosis not present

## 2024-02-05 DIAGNOSIS — F902 Attention-deficit hyperactivity disorder, combined type: Secondary | ICD-10-CM | POA: Diagnosis not present

## 2024-08-06 DIAGNOSIS — E559 Vitamin D deficiency, unspecified: Secondary | ICD-10-CM | POA: Diagnosis not present

## 2024-08-06 DIAGNOSIS — Z319 Encounter for procreative management, unspecified: Secondary | ICD-10-CM | POA: Diagnosis not present

## 2024-08-06 DIAGNOSIS — Z3149 Encounter for other procreative investigation and testing: Secondary | ICD-10-CM | POA: Diagnosis not present

## 2024-08-06 DIAGNOSIS — Z3169 Encounter for other general counseling and advice on procreation: Secondary | ICD-10-CM | POA: Diagnosis not present

## 2024-08-14 DIAGNOSIS — F901 Attention-deficit hyperactivity disorder, predominantly hyperactive type: Secondary | ICD-10-CM | POA: Diagnosis not present

## 2024-09-17 DIAGNOSIS — B9689 Other specified bacterial agents as the cause of diseases classified elsewhere: Secondary | ICD-10-CM | POA: Diagnosis not present

## 2024-09-17 DIAGNOSIS — N3080 Other cystitis without hematuria: Secondary | ICD-10-CM | POA: Diagnosis not present
